# Patient Record
Sex: Male | Born: 1995 | Race: White | Hispanic: No | Marital: Single | Smoking: Never smoker
Health system: Southern US, Community
[De-identification: ages and names within clinical notes are randomized; demographics above are authoritative.]

## PROBLEM LIST (undated history)

## (undated) DIAGNOSIS — E109 Type 1 diabetes mellitus without complications: Secondary | ICD-10-CM

---
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
## 2014-03-12 ENCOUNTER — Ambulatory Visit: Payer: Managed Care, Other (non HMO) | Admitting: Internal Medicine

## 2014-12-24 ENCOUNTER — Telehealth: Payer: Self-pay

## 2014-12-24 NOTE — Telephone Encounter (Signed)
I contacted the pt's mother and advised A1C blood test is due. She stated the pt is seeing a Actor in [place]. At next office visit the mother is going to request the A1c results be faxed to Dr. Karle Starch office.

## 2015-09-04 ENCOUNTER — Telehealth: Payer: Self-pay

## 2015-09-04 NOTE — Telephone Encounter (Signed)
[facility] in [place] is the best place for inpatient stay for alcohol problems. AA is good for many people---especially if you can't stop work, school, etc If he doesn't want help, I am not sure what can help. Can set up appt for me to discuss with him what is going on--if she would like

## 2015-09-04 NOTE — Telephone Encounter (Signed)
Sean Perkins pts mom(DPR signed) left v/m; pt told his mother that he has a drinking problem and is asking for help; Sean Perkins wants to know who Dr Alphonsus Sias would suggest in this area for pt to go to to stop drinking. Sean Perkins request cb on 09/05/15.

## 2015-09-05 NOTE — Telephone Encounter (Signed)
Spoke to Sun MicrosystemsDeborah, patient's mom. She verbalized understanding. She will call us if she needs any help with him.

## 2015-10-28 ENCOUNTER — Ambulatory Visit: Payer: Managed Care, Other (non HMO) | Admitting: Internal Medicine

## 2015-10-31 ENCOUNTER — Ambulatory Visit (INDEPENDENT_AMBULATORY_CARE_PROVIDER_SITE_OTHER): Payer: Managed Care, Other (non HMO) | Admitting: Internal Medicine

## 2015-10-31 ENCOUNTER — Encounter: Payer: Self-pay | Admitting: Internal Medicine

## 2015-10-31 VITALS — BP 126/82 | HR 82 | Temp 98.4°F | Wt 146.5 lb

## 2015-10-31 DIAGNOSIS — F900 Attention-deficit hyperactivity disorder, predominantly inattentive type: Secondary | ICD-10-CM | POA: Diagnosis not present

## 2015-10-31 DIAGNOSIS — F419 Anxiety disorder, unspecified: Principal | ICD-10-CM

## 2015-10-31 DIAGNOSIS — F418 Other specified anxiety disorders: Secondary | ICD-10-CM | POA: Diagnosis not present

## 2015-10-31 DIAGNOSIS — F32A Depression, unspecified: Secondary | ICD-10-CM

## 2015-10-31 DIAGNOSIS — F329 Major depressive disorder, single episode, unspecified: Secondary | ICD-10-CM

## 2015-10-31 DIAGNOSIS — F1021 Alcohol dependence, in remission: Secondary | ICD-10-CM

## 2015-10-31 MED ORDER — ESCITALOPRAM OXALATE 10 MG PO TABS: ORAL_TABLET | ORAL | Status: DC

## 2015-10-31 NOTE — Patient Instructions (Signed)
Generalized Anxiety Disorder Generalized anxiety disorder (GAD) is a mental disorder. It interferes with life functions, including relationships, work, and school. GAD is different from normal anxiety, which everyone experiences at some point in their lives in response to specific life events and activities. Normal anxiety actually helps us prepare for and get through these life events and activities. Normal anxiety goes away after the event or activity is over.  GAD causes anxiety that is not necessarily related to specific events or activities. It also causes excess anxiety in proportion to specific events or activities. The anxiety associated with GAD is also difficult to control. GAD can vary from mild to severe. People with severe GAD can have intense waves of anxiety with physical symptoms (panic attacks).  SYMPTOMS The anxiety and worry associated with GAD are difficult to control. This anxiety and worry are related to many life events and activities and also occur more days than not for 6 months or longer. People with GAD also have three or more of the following symptoms (one or more in children):  Restlessness.   Fatigue.  Difficulty concentrating.   Irritability.  Muscle tension.  Difficulty sleeping or unsatisfying sleep. DIAGNOSIS GAD is diagnosed through an assessment by your health care provider. Your health care provider will ask you questions aboutyour mood,physical symptoms, and events in your life. Your health care provider may ask you about your medical history and use of alcohol or drugs, including prescription medicines. Your health care provider may also do a physical exam and blood tests. Certain medical conditions and the use of certain substances can cause symptoms similar to those associated with GAD. Your health care provider may refer you to a mental health specialist for further evaluation. TREATMENT The following therapies are usually used to treat GAD:    Medication. Antidepressant medication usually is prescribed for long-term daily control. Antianxiety medicines may be added in severe cases, especially when panic attacks occur.   Talk therapy (psychotherapy). Certain types of talk therapy can be helpful in treating GAD by providing support, education, and guidance. A form of talk therapy called cognitive behavioral therapy can teach you healthy ways to think about and react to daily life events and activities.  Stress managementtechniques. These include yoga, meditation, and exercise and can be very helpful when they are practiced regularly. A mental health specialist can help determine which treatment is best for you. Some people see improvement with one therapy. However, other people require a combination of therapies.   This information is not intended to replace advice given to you by your health care provider. Make sure you discuss any questions you have with your health care provider.   Document Released: 10/16/2012 Document Revised: 07/12/2014 Document Reviewed: 10/16/2012 Elsevier Interactive Patient Education 2016 Elsevier Inc.  

## 2015-10-31 NOTE — Progress Notes (Signed)
Subjective:    Patient ID: Sean Perkins, male    DOB: 11/11/1995, 20 y.o.   MRN: [id number]  HPI  Pt presents to the clinic today to discuss ADHD, anxiety and depression.  ADHD: He was tested in middle school by school psychologist Terence Lux, but never treated because his mother did not want him to have to take medication daily. He c/o difficulty with motivation, trouble focusing, difficulty concentrating only read a paragraph or two. He is currently working full time. He is able to make it to work on time. He may consider going back to school in the future.  Anxiety and Depression: He has never been treated for this in the past. He reports he feels more anxious than depressed. He denies SI/HI. He is concerned about having to take daily medication for this condition, but he is interested in some form of treatment.  Recent admission at [facility], for alcohol abuse. He has been sober since he has been out.  Review of Systems      Past Medical History  Diagnosis Date  . IDDM (insulin dependent diabetes mellitus) ([place])     Current Outpatient Prescriptions  Medication Sig Dispense Refill  . Blood Glucose Monitoring Suppl (ONE TOUCH ULTRA MINI) w/Device KIT by Does not apply route.    Marland Kitchen glucose blood (ONE TOUCH ULTRA TEST) test strip Use as directed to check BG 6 x daily    . Insulin Aspart (NOVOLOG FLEXPEN [place]) Use as directed up to 60 units daily    . Insulin Glargine (LANTUS SOLOSTAR [place]) Inject 30 Units as directed at bedtime.     Marland Kitchen escitalopram (LEXAPRO) 10 MG tablet Take 1/2 tablet daily x 1 week, then increase to a whole tablet 30 tablet 1   No current facility-administered medications for this visit.    No Known Allergies  Family History  Problem Relation Age of Onset  . Fibromyalgia Mother   . Hyperlipidemia Father   . Goiter Sister   . Cancer Other     breast  . Heart disease Other     Social History   Social History  . Marital Status: Single   Spouse Name: N/A  . Number of Children: N/A  . Years of Education: N/A   Occupational History  . Not on file.   Social History Main Topics  . Smoking status: Never Smoker   . Smokeless tobacco: Never Used  . Alcohol Use: No  . Drug Use: No  . Sexual Activity: Not on file   Other Topics Concern  . 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  Respiratory: Denies difficulty breathing, shortness of breath, cough or sputum production.   Cardiovascular: Denies chest pain, chest tightness, palpitations or swelling in the hands or feet.  Neurological: Denies dizziness, difficulty with memory, difficulty with speech or problems with balance and coordination.  Psych: Pt reports anxiety and depression. Denies SI/HI.  No other specific complaints in a complete review of systems (except as listed in HPI above).  Objective:   Physical Exam  BP 126/82 mmHg  Pulse 82  Temp(Src) 98.4 F (36.9 C) (Oral)  Wt 146 lb 8 oz (66.452 kg)  SpO2 98% Wt Readings from Last 3 Encounters:  10/31/15 146 lb 8 oz (66.452 kg)  12/12/13 138 lb (  62.596 kg) (27 %*, Z = -0.62)  08/25/12 133 lb (60.328 kg) (28 %*, Z = -0.58)   * Growth percentiles are based on CDC 2-20 Years data.    General: Appears his stated age, well developed, well nourished in NAD. Cardiovascular: Normal rate and rhythm. S1,S2 noted.  No murmur, rubs or gallops noted.  Pulmonary/Chest: Normal effort and positive vesicular breath sounds. No respiratory distress. No wheezes, rales or ronchi noted.  Neurological: Alert and oriented.  Psychiatric: He is very anxious.       Assessment & Plan:  ADHD:  I think we should treat his anxiety, since he is currently working without any issues at this time. If persist, advised him to bring a copy of the psychologist testing or we  could refer him for formal ADHD testing and treatment recommendations.  Anxiety and Depression:  PHQ 9 and anxiety questionnaire done Primarily anxiety He is not interested in CBT at this time Discussed treatment options with him Will trial Lexapro 10 mg daily  Prior alcohol abuse:  He is currently sober Advised him to join AA for extra support  RTC in 6 weeks to follow up anxiety and depression

## 2015-10-31 NOTE — Progress Notes (Signed)
Pre visit review using our clinic review tool, if applicable. No additional management support is needed unless otherwise documented below in the visit note. 

## 2015-11-28 ENCOUNTER — Ambulatory Visit: Payer: Self-pay | Admitting: Internal Medicine

## 2016-06-06 ENCOUNTER — Encounter: Payer: Self-pay | Admitting: *Deleted

## 2016-06-06 ENCOUNTER — Ambulatory Visit
Admission: EM | Admit: 2016-06-06 | Discharge: 2016-06-06 | Disposition: A | Discharge status: Home / Self Care | Payer: Managed Care, Other (non HMO) | Attending: Family Medicine | Admitting: Family Medicine

## 2016-06-06 DIAGNOSIS — J069 Acute upper respiratory infection, unspecified: Secondary | ICD-10-CM

## 2016-06-06 DIAGNOSIS — B9789 Other viral agents as the cause of diseases classified elsewhere: Secondary | ICD-10-CM | POA: Diagnosis not present

## 2016-06-06 NOTE — ED Provider Notes (Signed)
[facility]    CSN: [id number] Arrival date & time: 06/06/16  1315     History   Chief Complaint Chief Complaint  Patient presents with  . Cough  . Chest Pain  . Fever    HPI Sean Perkins is a 20 y.o. male.   The history is provided by the patient.  Cough  Associated symptoms: chest pain and fever   Associated symptoms: no headaches and no wheezing   Chest Pain  Associated symptoms: cough and fever   Associated symptoms: no headache   Fever  Associated symptoms: chest pain, congestion and cough   Associated symptoms: no headaches   URI  Presenting symptoms: congestion, cough and fever   Severity:  Moderate Onset quality:  Sudden Duration:  5 days Timing:  Constant Progression:  Worsening Chronicity:  New Relieved by:  Nothing Ineffective treatments:  OTC medications Associated symptoms: no headaches, no sinus pain and no wheezing   Risk factors: diabetes mellitus   Risk factors: not elderly, no chronic cardiac disease, no chronic kidney disease, no chronic respiratory disease, no immunosuppression, no recent illness, no recent travel and no sick contacts     Past Medical History:  Diagnosis Date  . IDDM (insulin dependent diabetes mellitus) [facility])     Patient Active Problem List   Diagnosis Date Noted  . Anxiety state, unspecified 12/12/2013  . Well adolescent visit 08/25/2012  . IDDM (insulin dependent diabetes mellitus) ([place])     History reviewed. No pertinent surgical history.     Home Medications    Prior to Admission medications   Medication Sig Start Date End Date Taking? Authorizing Provider  Blood Glucose Monitoring Suppl (ONE TOUCH ULTRA MINI) w/Device KIT by Does not apply route.   Yes Historical Provider, MD  glucose blood (ONE TOUCH ULTRA TEST) test strip Use as directed to check BG 6 x daily 07/27/13  Yes Historical Provider, MD  Insulin Aspart (NOVOLOG FLEXPEN [place]) Use as directed up to 60 units daily 07/27/13  Yes  Historical Provider, MD  Insulin Glargine (BASAGLAR KWIKPEN) 100 UNIT/ML SOPN Inject into the skin at bedtime.   Yes Historical Provider, MD  escitalopram (LEXAPRO) 10 MG tablet Take 1/2 tablet daily x 1 week, then increase to a whole tablet 10/31/15   Jearld Fenton, NP  Insulin Glargine (LANTUS SOLOSTAR [place]) Inject 30 Units as directed at bedtime.  07/27/13   Historical Provider, MD    Family History Family History  Problem Relation Age of Onset  . Fibromyalgia Mother   . Hyperlipidemia Father   . Goiter Sister   . Cancer Other     breast  . Heart disease Other     Social History Social History  Substance Use Topics  . Smoking status: Never Smoker  . Smokeless tobacco: Never Used  . Alcohol use No     Allergies   Patient has no known allergies.   Review of Systems Review of Systems  Constitutional: Positive for fever.  HENT: Positive for congestion. Negative for sinus pain.   Respiratory: Positive for cough. Negative for wheezing.   Cardiovascular: Positive for chest pain.  Neurological: Negative for headaches.     Physical Exam Triage Vital Signs ED Triage Vitals  Enc Vitals Group     BP 06/06/16 1334 (!) 143/88     Pulse Rate 06/06/16 1334 90     Resp 06/06/16 1334 16     Temp 06/06/16 1334 97.4 F (36.3 C)  Temp Source 06/06/16 1334 Oral     SpO2 06/06/16 1334 100 %     Weight 06/06/16 1337 140 lb (63.5 kg)     Height 06/06/16 1337 5' 10"  (1.778 m)     Head Circumference --      Peak Flow --      Pain Score --      Pain Loc --      Pain Edu? --      Excl. in [place]? --    No data found.   Updated Vital Signs BP (!) 143/88 (BP Location: Left Arm)   Pulse 90   Temp 97.4 F (36.3 C) (Oral)   Resp 16   Ht 5' 10"  (1.778 m)   Wt 140 lb (63.5 kg)   SpO2 100%   BMI 20.09 kg/m   Visual Acuity Right Eye Distance:   Left Eye Distance:   Bilateral Distance:    Right Eye Near:   Left Eye Near:    Bilateral Near:     Physical Exam    Constitutional: He appears well-developed and well-nourished. No distress.  HENT:  Head: Normocephalic and atraumatic.  Right Ear: Tympanic membrane, external ear and ear canal normal.  Left Ear: Tympanic membrane, external ear and ear canal normal.  Nose: Nose normal.  Mouth/Throat: Uvula is midline, oropharynx is clear and moist and mucous membranes are normal. No oropharyngeal exudate or tonsillar abscesses.  Eyes: Conjunctivae and EOM are normal. Pupils are equal, round, and reactive to light. Right eye exhibits no discharge. Left eye exhibits no discharge. No scleral icterus.  Neck: Normal range of motion. Neck supple. No tracheal deviation present. No thyromegaly present.  Cardiovascular: Normal rate, regular rhythm and normal heart sounds.   Pulmonary/Chest: Effort normal and breath sounds normal. No stridor. No respiratory distress. He has no wheezes. He has no rales. He exhibits no tenderness.  Lymphadenopathy:    He has no cervical adenopathy.  Neurological: He is alert.  Skin: Skin is warm and dry. No rash noted. He is not diaphoretic.  Nursing note and vitals reviewed.    UC Treatments / Results  Labs (all labs ordered are listed, but only abnormal results are displayed) Labs Reviewed - No data to display  EKG  EKG Interpretation None       Radiology No results found.  Procedures Procedures (including critical care time)  Medications Ordered in UC Medications - No data to display   Initial Impression / Assessment and Plan / UC Course  I have reviewed the triage vital signs and the nursing notes.  Pertinent labs & imaging results that were available during my care of the patient were reviewed by me and considered in my medical decision making (see chart for details).  Clinical Course       Final Clinical Impressions(s) / UC Diagnoses   Final diagnoses:  Viral URI with cough    New Prescriptions New Prescriptions   No medications on file   1.  diagnosis reviewed with patient 2. Recommend supportive treatment with rest, fluids, otc analgesics prn 3. Follow-up prn if symptoms worsen or don't improve   Norval Gable, MD 06/06/16 1435

## 2016-06-06 NOTE — ED Triage Notes (Signed)
Non-productive cough, sore throat, fever, chest soreness with coughing since Thursday. Pt has IDDM since he was 9. Today's CBG at home was 250, and states recent difficulty controlling his glucose due to this illness.

## 2016-06-09 ENCOUNTER — Telehealth: Payer: Self-pay

## 2016-06-09 NOTE — Telephone Encounter (Signed)
Courtesy call back completed today for patient's recent visit [facility]. Patient did not answer,unable to leave message on machine due to voicemail being full. Patient will call back with any questions or concerns.

## 2016-09-23 DIAGNOSIS — R05 Cough: Secondary | ICD-10-CM | POA: Diagnosis present

## 2016-09-23 DIAGNOSIS — E109 Type 1 diabetes mellitus without complications: Secondary | ICD-10-CM | POA: Insufficient documentation

## 2016-09-23 DIAGNOSIS — J069 Acute upper respiratory infection, unspecified: Secondary | ICD-10-CM | POA: Diagnosis not present

## 2016-09-23 NOTE — ED Triage Notes (Signed)
Pt in with co cough for over a week, has runny nose and congestion. Has tried otc nasal spray but has had little relief. Pt feels a lot of pressure to face and head.

## 2016-09-24 ENCOUNTER — Encounter: Payer: Self-pay | Admitting: Emergency Medicine

## 2016-09-24 ENCOUNTER — Emergency Department
Admission: EM | Admit: 2016-09-24 | Discharge: 2016-09-24 | Disposition: A | Discharge status: Home / Self Care | Payer: Commercial Managed Care - PPO | Attending: Emergency Medicine | Admitting: Emergency Medicine

## 2016-09-24 ENCOUNTER — Emergency Department: Payer: Commercial Managed Care - PPO

## 2016-09-24 DIAGNOSIS — J069 Acute upper respiratory infection, unspecified: Secondary | ICD-10-CM

## 2016-09-24 DIAGNOSIS — B9789 Other viral agents as the cause of diseases classified elsewhere: Secondary | ICD-10-CM

## 2016-09-24 HISTORY — DX: Type 1 diabetes mellitus without complications: E10.9

## 2016-09-24 MED ORDER — HYDROCODONE-HOMATROPINE 5-1.5 MG/5ML PO SYRP: 5.0000 mL | ORAL_SOLUTION | Freq: Four times a day (QID) | ORAL | 0 refills | Status: DC | PRN

## 2016-09-24 NOTE — ED Notes (Signed)
Patient transported to X-ray 

## 2016-09-24 NOTE — ED Provider Notes (Signed)
 [facility] Emergency Department Provider Note  ____________________________________________   First MD Initiated Contact with Patient 09/24/16 0111     (approximate)  I have reviewed the triage vital signs and the nursing notes.   HISTORY  Chief Complaint Cough    HPI Sean Perkins is a 20 y.o. male with a history of type 1 diabetes who presents for gradual onset of URI symptoms for about 5 days.  He reports general malaise, severe nasal congestion, some general body aches, fever which has resolved within the last day or 2, persistent productive cough, chest pain when he coughs, swollen lymph nodes in his neck, and a mild sore throat.These symptoms have been severe although they are getting better and are now mild.  He is mostly concerned because of the persistent productive cough which seems to be worse at night.  He denies any chest pain except for when he coughs, and denies nausea, vomiting, abdominal pain, diarrhea, constipation, dysuria.  Nothing in particular makes his symptoms better or worse.  Although he is a type I diabetic, he states that he tries hard to keep his glucose under control and it has been running a little higher than usual but not too bad (in the low 300s at times).   Past Medical History:  Diagnosis Date  . Type 1 diabetes (HCC)     Patient Active Problem List   Diagnosis Date Noted  . Anxiety state, unspecified 12/12/2013  . Well adolescent visit 08/25/2012  . IDDM (insulin dependent diabetes mellitus) (HCC)     History reviewed. No pertinent surgical history.  Prior to Admission medications   Medication Sig Start Date End Date Taking? Authorizing Provider  Blood Glucose Monitoring Suppl (ONE TOUCH ULTRA MINI) w/Device KIT by Does not apply route.    Historical Provider, MD  escitalopram (LEXAPRO) 10 MG tablet Take 1/2 tablet daily x 1 week, then increase to a whole tablet 10/31/15   Regina W Baity, NP  glucose blood (ONE  TOUCH ULTRA TEST) test strip Use as directed to check BG 6 x daily 07/27/13   Historical Provider, MD  HYDROcodone-homatropine (HYCODAN) 5-1.5 MG/5ML syrup Take 5 mLs by mouth every 6 (six) hours as needed for cough. 09/24/16   Cory Forbach, MD  Insulin Aspart (NOVOLOG FLEXPEN [place]) Use as directed up to 60 units daily 07/27/13   Historical Provider, MD  Insulin Glargine (BASAGLAR KWIKPEN) 100 UNIT/ML SOPN Inject into the skin at bedtime.    Historical Provider, MD  Insulin Glargine (LANTUS SOLOSTAR [place]) Inject 30 Units as directed at bedtime.  07/27/13   Historical Provider, MD    Allergies Patient has no known allergies.  Family History  Problem Relation Age of Onset  . Fibromyalgia Mother   . Hyperlipidemia Father   . Goiter Sister   . Cancer Other     breast  . Heart disease Other     Social History Social History  Substance Use Topics  . Smoking status: Never Smoker  . Smokeless tobacco: Never Used  . Alcohol use No    Review of Systems Constitutional: Fever/chills several days ago, now resolved.   Eyes: No visual changes. ENT: No sore throat. Cardiovascular: Denies chest pain. Respiratory: Denies shortness of breath. Gastrointestinal: No abdominal pain.  No nausea, no vomiting.  No diarrhea.  No constipation. Genitourinary: Negative for dysuria. Musculoskeletal: Negative for back pain. Skin: Negative for rash. Neurological: Negative for headaches, focal weakness or numbness.  10-point ROS otherwise negative.  ____________________________________________     PHYSICAL EXAM:  VITAL SIGNS: ED Triage Vitals  Enc Vitals Group     BP 09/23/16 2217 (!) 154/95     Pulse Rate 09/23/16 2217 86     Resp 09/23/16 2217 18     Temp 09/23/16 2217 97.5 F (36.4 C)     Temp Source 09/23/16 2217 Oral     SpO2 09/23/16 2217 100 %     Weight 09/23/16 2217 140 lb (63.5 kg)     Height 09/23/16 2217 5' 10" (1.778 m)     Head Circumference --      Peak Flow --      Pain Score  09/23/16 2221 4     Pain Loc --      Pain Edu? --      Excl. in [place]? --     Constitutional: Alert and oriented. Well appearing and in no acute distress. Eyes: Conjunctivae are normal. PERRL. EOMI. Head: Atraumatic. Nose: Significant congestion Mouth/Throat: Mucous membranes are moist.  Oropharynx non-erythematous. Neck: No stridor.  No meningeal signs.  No cervical lymphadenopathy Cardiovascular: Normal rate, regular rhythm. Good peripheral circulation. Grossly normal heart sounds. Respiratory: Normal respiratory effort.  No retractions. Lungs CTAB. Gastrointestinal: Soft and nontender. No distention.  Musculoskeletal: No lower extremity tenderness nor edema. No gross deformities of extremities. Neurologic:  Normal speech and language. No gross focal neurologic deficits are appreciated.  Skin:  Skin is warm, dry and intact. No rash noted. Psychiatric: Mood and affect are normal. Speech and behavior are normal.  ____________________________________________   LABS (all labs ordered are listed, but only abnormal results are displayed)  Labs Reviewed - No data to display ____________________________________________  EKG  None - EKG not ordered by ED physician ____________________________________________  RADIOLOGY   Dg Chest 2 View  Result Date: 09/24/2016 CLINICAL DATA:  Acute onset of dry cough, chest congestion and lower mid chest pain. Fever. Initial encounter. EXAM: CHEST  2 VIEW COMPARISON:  None. FINDINGS: The lungs are well-aerated and clear. There is no evidence of focal opacification, pleural effusion or pneumothorax. The heart is normal in size; the mediastinal contour is within normal limits. No acute osseous abnormalities are seen. IMPRESSION: No acute cardiopulmonary process seen. Electronically Signed   By: Garald Balding M.D.   On: 09/24/2016 01:55    ____________________________________________   PROCEDURES  Critical Care performed: No   Procedure(s)  performed:   Procedures   ____________________________________________   INITIAL IMPRESSION / ASSESSMENT AND PLAN / ED COURSE  Pertinent labs & imaging results that were available during my care of the patient were reviewed by me and considered in my medical decision making (see chart for details).  The patient is well-appearing and in no acute distress.  He has obvious viral symptoms but given that he has type 1 diabetes and has productive cough I will check a two-view chest x-ray to make sure he does not have any evidence of pneumonia.  I explained that he may have influenza B which has been or prevalent recently but his vital signs are normal and he has had symptoms for 5 days so there is no indication to check specifically for influenza.  I anticipate that he will go home after the chest x-ray and I gave my usual and customary viral infection recognition's and return precautions.   Clinical Course as of Sep 25 250  Fri Sep 24, 2016  0113 I reviewed the patient's prescription history over the last 12 months in the multi-state controlled substances database(s) that  includes [place], [place], [place], [place], [place], [place], [place], [place], [place], [place], [place], [place], [place], and [place].  The patient has filled no controlled substances during that time.   [CF]  0200 No acute process appreciated.  Will proceed with plan for d/c and outpatient follow up. DG Chest 2 View [CF]    Clinical Course User Index [CF] Hinda Kehr, MD    ____________________________________________  FINAL CLINICAL IMPRESSION(S) / ED DIAGNOSES  Final diagnoses:  Viral URI with cough     MEDICATIONS GIVEN DURING THIS VISIT:  Medications - No data to display   NEW OUTPATIENT MEDICATIONS STARTED DURING THIS VISIT:  Discharge Medication List as of 09/24/2016  2:25 AM    START taking these medications   Details  HYDROcodone-homatropine (HYCODAN) 5-1.5  MG/5ML syrup Take 5 mLs by mouth every 6 (six) hours as needed for cough., Starting Fri 09/24/2016, Print        Discharge Medication List as of 09/24/2016  2:25 AM      Discharge Medication List as of 09/24/2016  2:25 AM       Note:  This document was prepared using Dragon voice recognition software and may include unintentional dictation errors.    Hinda Kehr, MD 09/24/16 [phone]

## 2016-09-24 NOTE — Discharge Instructions (Signed)

## 2016-12-14 ENCOUNTER — Ambulatory Visit
Admission: EM | Admit: 2016-12-14 | Discharge: 2016-12-14 | Disposition: A | Discharge status: Home / Self Care | Payer: Commercial Managed Care - PPO | Attending: Family Medicine | Admitting: Family Medicine

## 2016-12-14 DIAGNOSIS — L03115 Cellulitis of right lower limb: Secondary | ICD-10-CM

## 2016-12-14 DIAGNOSIS — S80861A Insect bite (nonvenomous), right lower leg, initial encounter: Secondary | ICD-10-CM

## 2016-12-14 DIAGNOSIS — W57XXXA Bitten or stung by nonvenomous insect and other nonvenomous arthropods, initial encounter: Secondary | ICD-10-CM

## 2016-12-14 MED ORDER — DOXYCYCLINE HYCLATE 100 MG PO TABS: 100.0000 mg | ORAL_TABLET | Freq: Two times a day (BID) | ORAL | 0 refills | Status: DC

## 2016-12-14 NOTE — ED Provider Notes (Signed)
[facility]    CSN: [id number] Arrival date & time: 12/14/16  1806     History   Chief Complaint Chief Complaint  Patient presents with  . Tick Removal    HPI Sean Perkins is a 21 y.o. male.   21 yo male with a c/o tick bite to left ankle area and subsequent redness, warmth and tenderness to the area since yesterday. Patient states he pulled a tick off 2 days ago after being embedded less than 24 hours then yesterday noticed the rash. Denies any fevers, chills, drainage.    The history is provided by the patient.    Past Medical History:  Diagnosis Date  . Type 1 diabetes [facility])     Patient Active Problem List   Diagnosis Date Noted  . Anxiety state, unspecified 12/12/2013  . Well adolescent visit 08/25/2012  . IDDM (insulin dependent diabetes mellitus) ([place])     History reviewed. No pertinent surgical history.     Home Medications    Prior to Admission medications   Medication Sig Start Date End Date Taking? Authorizing Provider  Insulin Aspart (NOVOLOG FLEXPEN [place]) Use as directed up to 60 units daily 07/27/13  Yes [provider]  Insulin Glargine (BASAGLAR KWIKPEN) 100 UNIT/ML SOPN Inject into the skin at bedtime.   Yes [provider]  Blood Glucose Monitoring Suppl (ONE TOUCH ULTRA MINI) w/Device KIT by Does not apply route.    [provider]  doxycycline (VIBRA-TABS) 100 MG tablet Take 1 tablet (100 mg total) by mouth 2 (two) times daily. 12/14/16   Norval Gable, MD  escitalopram (LEXAPRO) 10 MG tablet Take 1/2 tablet daily x 1 week, then increase to a whole tablet 10/31/15   Jearld Fenton, NP  glucose blood (ONE TOUCH ULTRA TEST) test strip Use as directed to check BG 6 x daily 07/27/13   [provider]  HYDROcodone-homatropine (HYCODAN) 5-1.5 MG/5ML syrup Take 5 mLs by mouth every 6 (six) hours as needed for cough. 09/24/16   Hinda Kehr, MD  Insulin Glargine (LANTUS SOLOSTAR [place]) Inject 30 Units as  directed at bedtime.  07/27/13   [provider]    Family History Family History  Problem Relation Age of Onset  . Fibromyalgia Mother   . Hyperlipidemia Father   . Goiter Sister   . Cancer Other        breast  . Heart disease Other     Social History Social History  Substance Use Topics  . Smoking status: Never Smoker  . Smokeless tobacco: Never Used  . Alcohol use No     Allergies   Patient has no known allergies.   Review of Systems Review of Systems   Physical Exam Triage Vital Signs ED Triage Vitals  Enc Vitals Group     BP 12/14/16 1944 (!) 137/95     Pulse Rate 12/14/16 1944 67     Resp 12/14/16 1944 18     Temp 12/14/16 1944 97.6 F (36.4 C)     Temp Source 12/14/16 1944 Oral     SpO2 12/14/16 1944 100 %     Weight 12/14/16 1945 140 lb (63.5 kg)     Height 12/14/16 1945 _0  (1.778 m)     Head Circumference --      Peak Flow --      Pain Score 12/14/16 1944 0     Pain Loc --      Pain Edu? --  Excl. in [place]? --    No data found.   Updated Vital Signs BP (!) 137/95 (BP Location: Left Arm)   Pulse 67   Temp 97.6 F (36.4 C) (Oral)   Resp 18   Ht _0  (1.778 m)   Wt 140 lb (63.5 kg)   SpO2 100%   BMI 20.09 kg/m   Visual Acuity Right Eye Distance:   Left Eye Distance:   Bilateral Distance:    Right Eye Near:   Left Eye Near:    Bilateral Near:     Physical Exam  Constitutional: He appears well-developed and well-nourished. No distress.  Skin: He is not diaphoretic. There is erythema.  3x4cm skin area over medial left ankle with pinpoint puncture wound and surrounding warmth, blanchable erythema, and tenderness to palpation; no drainage  Nursing note and vitals reviewed.    UC Treatments / Results  Labs (all labs ordered are listed, but only abnormal results are displayed) Labs Reviewed - No data to display  EKG  EKG Interpretation None       Radiology No results found.  Procedures Procedures  (including critical care time)  Medications Ordered in UC Medications - No data to display   Initial Impression / Assessment and Plan / UC Course  I have reviewed the triage vital signs and the nursing notes.  Pertinent labs & imaging results that were available during my care of the patient were reviewed by me and considered in my medical decision making (see chart for details).       Final Clinical Impressions(s) / UC Diagnoses   Final diagnoses:  Tick bite, initial encounter  Cellulitis of leg, right    New Prescriptions Discharge Medication List as of 12/14/2016  8:41 PM    START taking these medications   Details  doxycycline (VIBRA-TABS) 100 MG tablet Take 1 tablet (100 mg total) by mouth 2 (two) times daily., Starting Tue 12/14/2016, Normal       1. diagnosis reviewed with patient 2. rx as per orders above; reviewed possible side effects, interactions, risks and benefits  3. Recommend supportive treatment with warm compresses to area 4. Follow-up prn if symptoms worsen or don't improve   Norval Gable, MD 12/14/16 2047

## 2016-12-14 NOTE — ED Triage Notes (Signed)
Pt removed tick from left ankle about 2 days ago and he now has a red rash around the area.

## 2017-07-06 ENCOUNTER — Ambulatory Visit
Admission: EM | Admit: 2017-07-06 | Discharge: 2017-07-06 | Disposition: A | Discharge status: Home / Self Care | Payer: Commercial Managed Care - PPO | Attending: Family Medicine | Admitting: Family Medicine

## 2017-07-06 ENCOUNTER — Encounter: Payer: Self-pay | Admitting: Emergency Medicine

## 2017-07-06 ENCOUNTER — Ambulatory Visit (INDEPENDENT_AMBULATORY_CARE_PROVIDER_SITE_OTHER): Payer: Commercial Managed Care - PPO

## 2017-07-06 ENCOUNTER — Other Ambulatory Visit: Payer: Self-pay

## 2017-07-06 DIAGNOSIS — S62145A Nondisplaced fracture of body of hamate [unciform] bone, left wrist, initial encounter for closed fracture: Secondary | ICD-10-CM | POA: Diagnosis not present

## 2017-07-06 NOTE — ED Triage Notes (Signed)
Patient states tha he was punching a bag at the gym and has pain in his left hand.

## 2017-07-06 NOTE — ED Provider Notes (Signed)
[facility] ____________________________________________  Time seen: Approximately 4562 PM  I have reviewed the triage vital signs and the nursing notes.   HISTORY  Chief Complaint Hand Pain (left)   HPI Sean Perkins is a 22 y.o. male presenting for evaluation of left hand pain since last night after being at the gym and punching a punching bag.  Patient reports left hand dominant.  Denies other pain or injuries.  States no previous fractures or trauma to left hand to his knowledge in the past.  Has not taken any over-the-counter medications for the same complaint.  Denies pain radiation, decreased movement, paresthesias or other complaints.  Reports otherwise feels well.  Denies other complaints Denies recent sickness. Denies recent antibiotic use.  Type I diabetic, reports blood sugars have been doing well.  Venia Carbon, MD: PCP   Past Medical History:  Diagnosis Date  . Type 1 diabetes [facility])     Patient Active Problem List   Diagnosis Date Noted  . Anxiety state, unspecified 12/12/2013  . Well adolescent visit 08/25/2012  . IDDM (insulin dependent diabetes mellitus) ([place])     History reviewed. No pertinent surgical history.   No current facility-administered medications for this encounter.   Current Outpatient Medications:  .  Insulin Aspart (NOVOLOG FLEXPEN [place]), Use as directed up to 60 units daily, Disp: , Rfl:  .  Insulin Glargine (LANTUS SOLOSTAR [employer]), Inject 30 Units as directed at bedtime. , Disp: , Rfl:  .  Blood Glucose Monitoring Suppl (ONE TOUCH ULTRA MINI) w/Device KIT, by Does not apply route., Disp: , Rfl:  .  glucose blood (ONE TOUCH ULTRA TEST) test strip, Use as directed to check BG 6 x daily, Disp: , Rfl:  .  HYDROcodone-homatropine (HYCODAN) 5-1.5 MG/5ML syrup, Take 5 mLs by mouth every 6 (six) hours as needed for cough., Disp: 120 mL, Rfl: 0 .  Insulin Glargine (BASAGLAR KWIKPEN) 100 UNIT/ML SOPN, Inject into the skin at bedtime.,  Disp: , Rfl:   Allergies Patient has no known allergies.  Family History  Problem Relation Age of Onset  . Fibromyalgia Mother   . Hyperlipidemia Father   . Goiter Sister   . Cancer Other        breast  . Heart disease Other     Social History Social History   Tobacco Use  . Smoking status: Never Smoker  . Smokeless tobacco: Never Used  Substance Use Topics  . Alcohol use: No    Alcohol/week: 0.0 oz  . Drug use: No    Review of Systems Constitutional: No fever/chills Cardiovascular: Denies chest pain. Respiratory: Denies shortness of breath. Gastrointestinal: No abdominal pain.   Musculoskeletal: Negative for back pain. Skin: Negative for rash.  ____________________________________________   PHYSICAL EXAM:  VITAL SIGNS: ED Triage Vitals  Enc Vitals Group     BP 07/06/17 1304 (!) 148/94     Pulse Rate 07/06/17 1304 78     Resp 07/06/17 1304 16     Temp 07/06/17 1304 98 F (36.7 C)     Temp Source 07/06/17 1304 Oral     SpO2 07/06/17 1304 100 %     Weight 07/06/17 1302 140 lb (63.5 kg)     Height 07/06/17 1302 5' 10"  (1.778 m)     Head Circumference --      Peak Flow --      Pain Score 07/06/17 1302 7     Pain Loc --      Pain Edu? --  Excl. in [place]? --     Constitutional: Alert and oriented. Well appearing and in no acute distress. Cardiovascular: Normal rate, regular rhythm. Grossly normal heart sounds.  Good peripheral circulation. Respiratory: Normal respiratory effort without tachypnea nor retractions. Breath sounds are clear and equal bilaterally. No wheezes, rales, rhonchi.. Musculoskeletal:   No midline cervical, thoracic or lumbar tenderness to palpation. Bilateral distal radial pulses equal and easily palpated. Except: Left proximal hand ulnar deviation proximal fourth and fifth metacarpal and mid metacarpals mild to moderate tenderness to palpation with mild localized swelling and ecchymosis, pain present with fourth and fifth digit resisted  flexion and extension, but full range of motion present, no motor or tendon deficit noted to left hand, normal distal sensation and capillary refill to left hand.  Left upper extremity otherwise nontender. Neurologic:  Normal speech and language.  Speech is normal. No gait instability.  Skin:  Skin is warm, dry and intact. No rash noted. Psychiatric: Mood and affect are normal. Speech and behavior are normal. Patient exhibits appropriate insight and judgment   ___________________________________________   LABS (all labs ordered are listed, but only abnormal results are displayed)  Labs Reviewed - No data to display ____________________________________________  RADIOLOGY  Dg Hand Complete Left  Addendum Date: 07/06/2017   ADDENDUM REPORT: 07/06/2017 13:42 ADDENDUM: After further review, there is pain at the base of the fifth metacarpal. There is soft tissue swelling dorsally in this area and possible fracture of the dorsal lateral hamate. Electronically Signed   By: Franchot Gallo M.D.   On: 07/06/2017 13:42   Result Date: 07/06/2017 CLINICAL DATA:  Left hand pain EXAM: LEFT HAND - COMPLETE 3+ VIEW COMPARISON:  None. FINDINGS: Mild deformity of the fifth metacarpal consistent with chronic healed fracture. No acute fracture or arthropathy. IMPRESSION: Negative. Electronically Signed: By: Franchot Gallo M.D. On: 07/06/2017 13:21   ____________________________________________   PROCEDURES Procedures   INITIAL IMPRESSION / ASSESSMENT AND PLAN / ED COURSE  Pertinent labs & imaging results that were available during my care of the patient were reviewed by me and considered in my medical decision making (see chart for details).  Well-appearing patient.  No acute distress.  Left hand pain post mechanical injury that occurred yesterday.  In reviewing the x-ray myself, suspect hamate fracture, called and discussed and reviewed with radiologist, who verbalized agreement and addendum as above.   Ulnar gutter OCL splint applied by nursing.  Encouraged ice, elevation.  Patient declined need for prescription pain medication states he will take over-the-counter Tylenol ibuprofen as needed.  Follow-up with orthopedic this week.  Information given.  Discussed follow up with Primary care physician this week as needed. Discussed follow up and return parameters including no resolution or any worsening concerns. Patient verbalized understanding and agreed to plan.   ____________________________________________   FINAL CLINICAL IMPRESSION(S) / ED DIAGNOSES  Final diagnoses:  Closed nondisplaced fracture of body of hamate of left wrist, initial encounter     ED Discharge Orders    None       Note: This dictation was prepared with Dragon dictation along with smaller phrase technology. Any transcriptional errors that result from this process are unintentional.         Marylene Land, NP 07/06/17 1525

## 2017-07-06 NOTE — Discharge Instructions (Signed)
Ice. Rest. Drink plenty of fluids.   Follow up with orthopedic this week. CAll today.  Follow up with your primary care physician this week as needed. Return to Urgent care for new or worsening concerns.

## 2017-08-02 ENCOUNTER — Other Ambulatory Visit: Payer: Self-pay

## 2017-08-02 ENCOUNTER — Ambulatory Visit
Admission: EM | Admit: 2017-08-02 | Discharge: 2017-08-02 | Disposition: A | Discharge status: Home / Self Care | Payer: Commercial Managed Care - PPO | Attending: Family Medicine | Admitting: Family Medicine

## 2017-08-02 DIAGNOSIS — R509 Fever, unspecified: Secondary | ICD-10-CM

## 2017-08-02 DIAGNOSIS — J039 Acute tonsillitis, unspecified: Secondary | ICD-10-CM

## 2017-08-02 DIAGNOSIS — J029 Acute pharyngitis, unspecified: Secondary | ICD-10-CM

## 2017-08-02 LAB — RAPID STREP SCREEN (MED CTR MEBANE ONLY): Streptococcus, Group A Screen (Direct): NEGATIVE

## 2017-08-02 MED ORDER — AMOXICILLIN 500 MG PO TABS: 500.0000 mg | ORAL_TABLET | Freq: Two times a day (BID) | ORAL | 0 refills | Status: DC

## 2017-08-02 NOTE — ED Triage Notes (Addendum)
4 days of sore throat and occasional cough. "My tonsils are killing me."

## 2017-08-02 NOTE — ED Provider Notes (Signed)
[facility]    CSN: [id number] Arrival date & time: 08/02/17  1404  History   Chief Complaint Chief Complaint  Patient presents with  . Sore Throat   HPI  22 year old male presents with sore throat.  Sore throat x 4 days.  Associated fever, T-max 100.0.  Associated mild cough.  No improvement with over-the-counter Tylenol.  No reported sick contacts.  No known exacerbating factors.  No other reported symptoms.  No other complaints this time.  Past Medical History:  Diagnosis Date  . Type 1 diabetes [facility])    Patient Active Problem List   Diagnosis Date Noted  . Anxiety state, unspecified 12/12/2013  . Well adolescent visit 08/25/2012  . IDDM (insulin dependent diabetes mellitus) ([place])    History reviewed. No pertinent surgical history.  Home Medications    Prior to Admission medications   Medication Sig Start Date End Date Taking? Authorizing Provider  insulin lispro (HUMALOG) 100 UNIT/ML injection Inject into the skin 3 (three) times daily before meals.   Yes [provider]  amoxicillin (AMOXIL) 500 MG tablet Take 1 tablet (500 mg total) by mouth 2 (two) times daily. 08/02/17   Coral Spikes, DO  Blood Glucose Monitoring Suppl (ONE TOUCH ULTRA MINI) w/Device KIT by Does not apply route.    [provider]  glucose blood (ONE TOUCH ULTRA TEST) test strip Use as directed to check BG 6 x daily 07/27/13   [provider]  Insulin Glargine (BASAGLAR KWIKPEN) 100 UNIT/ML SOPN Inject 27 Units into the skin at bedtime.     [provider]    Family History Family History  Problem Relation Age of Onset  . Fibromyalgia Mother   . Hyperlipidemia Father   . Goiter Sister   . Cancer Other        breast  . Heart disease Other     Social History Social History   Tobacco Use  . Smoking status: Never Smoker  . Smokeless tobacco: Never Used  Substance Use Topics  . Alcohol use: No    Alcohol/week: 0.0 oz  . Drug use: No      Allergies   Patient has no known allergies.   Review of Systems Review of Systems  Constitutional: Positive for fever.  HENT: Positive for sore throat.   Respiratory: Positive for cough.    Physical Exam Triage Vital Signs ED Triage Vitals [08/02/17 1430]  Enc Vitals Group     BP (!) 148/88     Pulse Rate 75     Resp 18     Temp 99.3 F (37.4 C)     Temp Source Oral     SpO2 99 %     Weight 140 lb (63.5 kg)     Height 5' 10"  (1.778 m)     Head Circumference      Peak Flow      Pain Score 8     Pain Loc      Pain Edu?      Excl. in [place]?    Updated Vital Signs BP (!) 148/88 (BP Location: Left Arm)   Pulse 75   Temp 99.3 F (37.4 C) (Oral)   Resp 18   Ht 5' 10"  (1.778 m)   Wt 140 lb (63.5 kg)   SpO2 99%   BMI 20.09 kg/m     Physical Exam  Constitutional: He is oriented to person, place, and time. He appears well-developed and well-nourished. No distress.  HENT:  Head: Normocephalic and atraumatic.  Oropharynx with erythema and tonsillar edema, left greater than right.  No exudate.  Cardiovascular: Normal rate and regular rhythm.  Pulmonary/Chest: Effort normal and breath sounds normal. He has no wheezes. He has no rales.  Neurological: He is alert and oriented to person, place, and time.  Psychiatric: He has a normal mood and affect. His behavior is normal.  Nursing note and vitals reviewed.  UC Treatments / Results  Labs (all labs ordered are listed, but only abnormal results are displayed) Labs Reviewed  RAPID STREP SCREEN (NOT AT [facility])  CULTURE, GROUP A STREP [facility])    EKG  EKG Interpretation None       Radiology No results found.  Procedures Procedures (including critical care time)  Medications Ordered in UC Medications - No data to display   Initial Impression / Assessment and Plan / UC Course  I have reviewed the triage vital signs and the nursing notes.  Pertinent labs & imaging results that were available during my care of  the patient were reviewed by me and considered in my medical decision making (see chart for details).     22 year old male presents sore throat.  Rapid strep negative.  Given his exam and history, I am concerned about culture returning positive. As a result, I am starting him on empiric Amoxicillin.  Final Clinical Impressions(s) / UC Diagnoses   Final diagnoses:  Tonsillitis    ED Discharge Orders        Ordered    amoxicillin (AMOXIL) 500 MG tablet  2 times daily     08/02/17 1551     Controlled Substance Prescriptions [place] Controlled Substance Registry consulted? Not Applicable   Coral Spikes, DO 08/02/17 1623

## 2017-08-05 LAB — CULTURE, GROUP A STREP (THRC)

## 2018-05-03 LAB — HM DIABETES EYE EXAM

## 2018-05-08 ENCOUNTER — Telehealth: Payer: Self-pay

## 2018-05-08 NOTE — Telephone Encounter (Signed)
Left message with Dr Leonides Cave office in regards to diabetic eye exam report we received on the patient. Awaiting response sometime later today before abstracting.

## 2018-05-10 ENCOUNTER — Encounter: Payer: Self-pay | Admitting: Internal Medicine

## 2018-05-12 ENCOUNTER — Encounter: Payer: Self-pay | Admitting: Optometry

## 2018-05-12 NOTE — Telephone Encounter (Signed)
Diabetic eye exam updated

## 2018-10-22 IMAGING — CR DG CHEST 2V
1 series · 2 of 2 positions shown · non-contrast
Comparison: None.

CLINICAL DATA: Acute onset of dry cough, chest congestion and lower
mid chest pain. Fever. Initial encounter.

EXAM:
CHEST  2 VIEW

[Series 1: dg chest 2 view · 0.14mm/px · 2 of 2 slices shown]
[im 1/2]
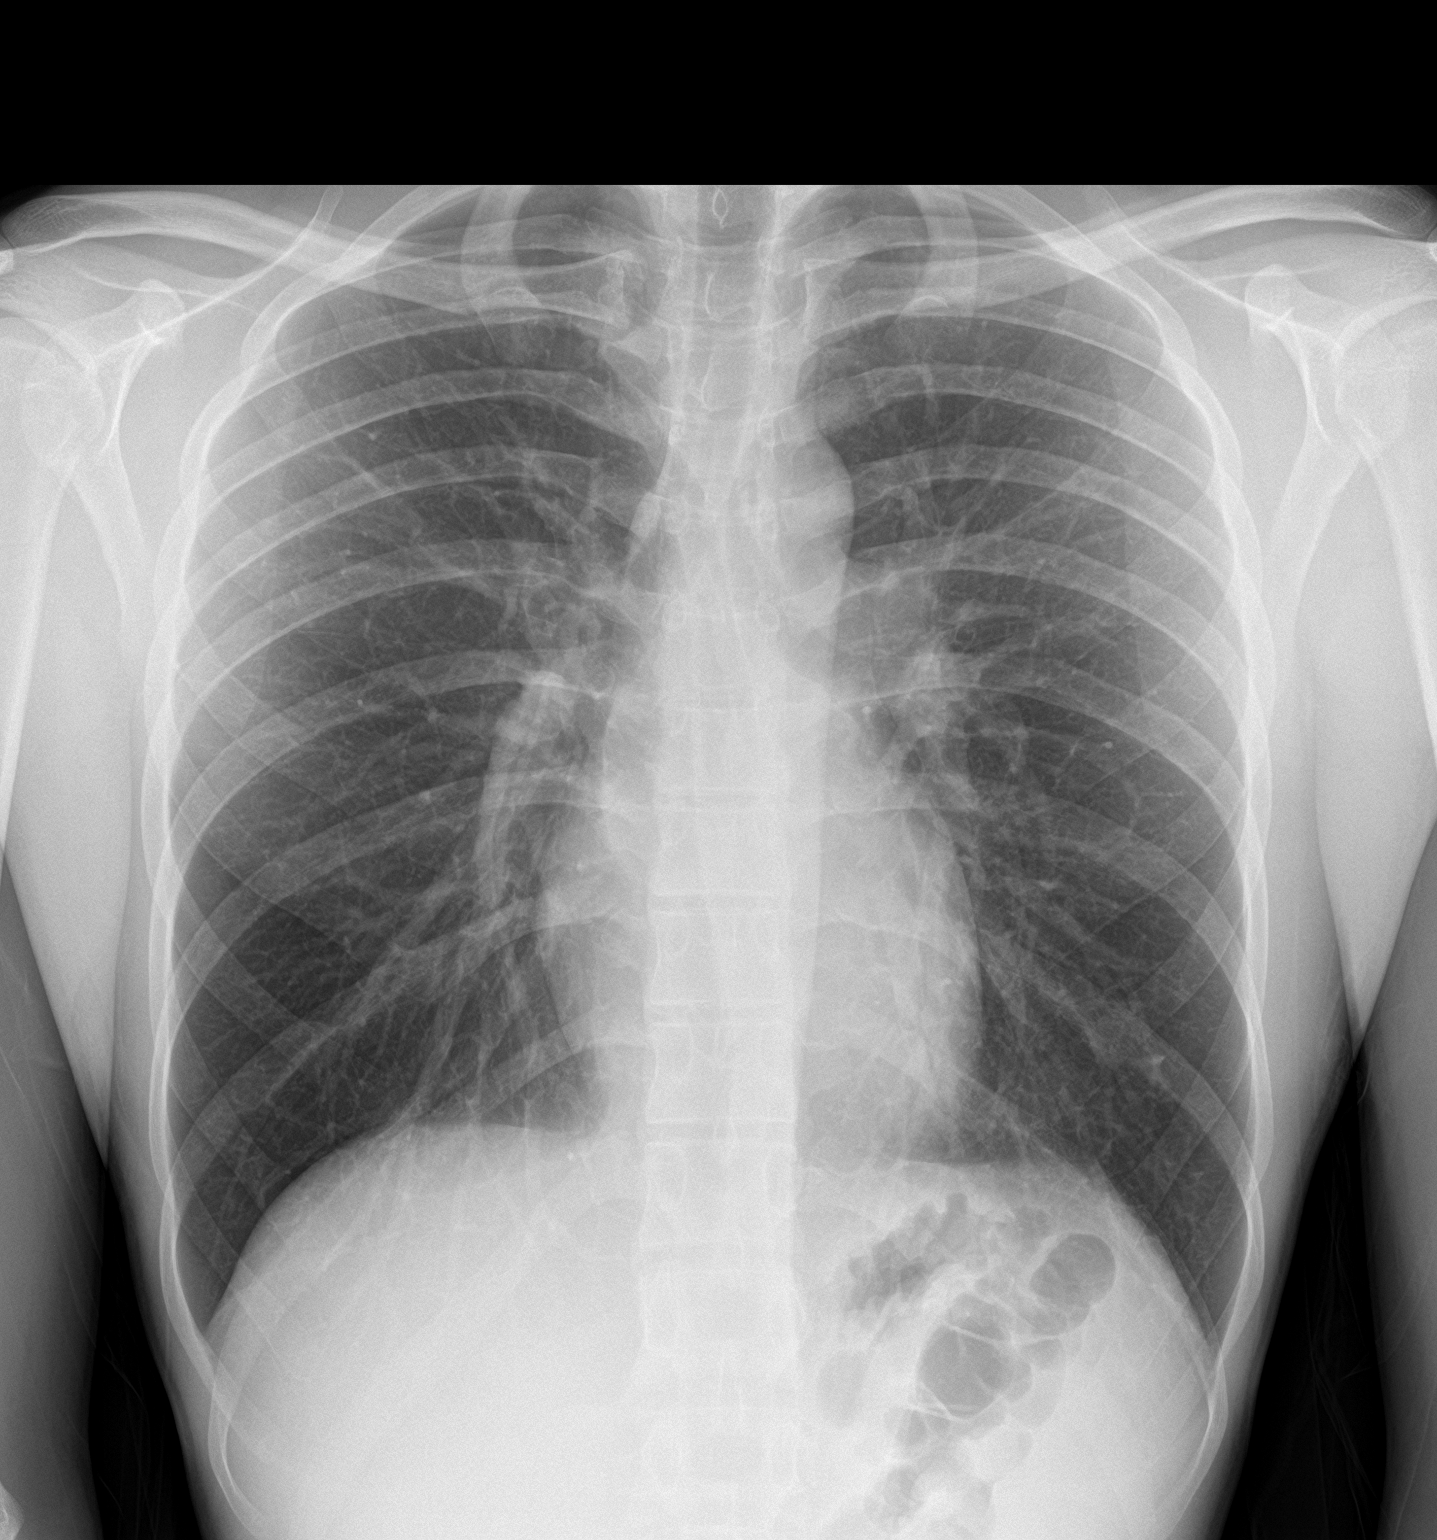
[im 2/2]
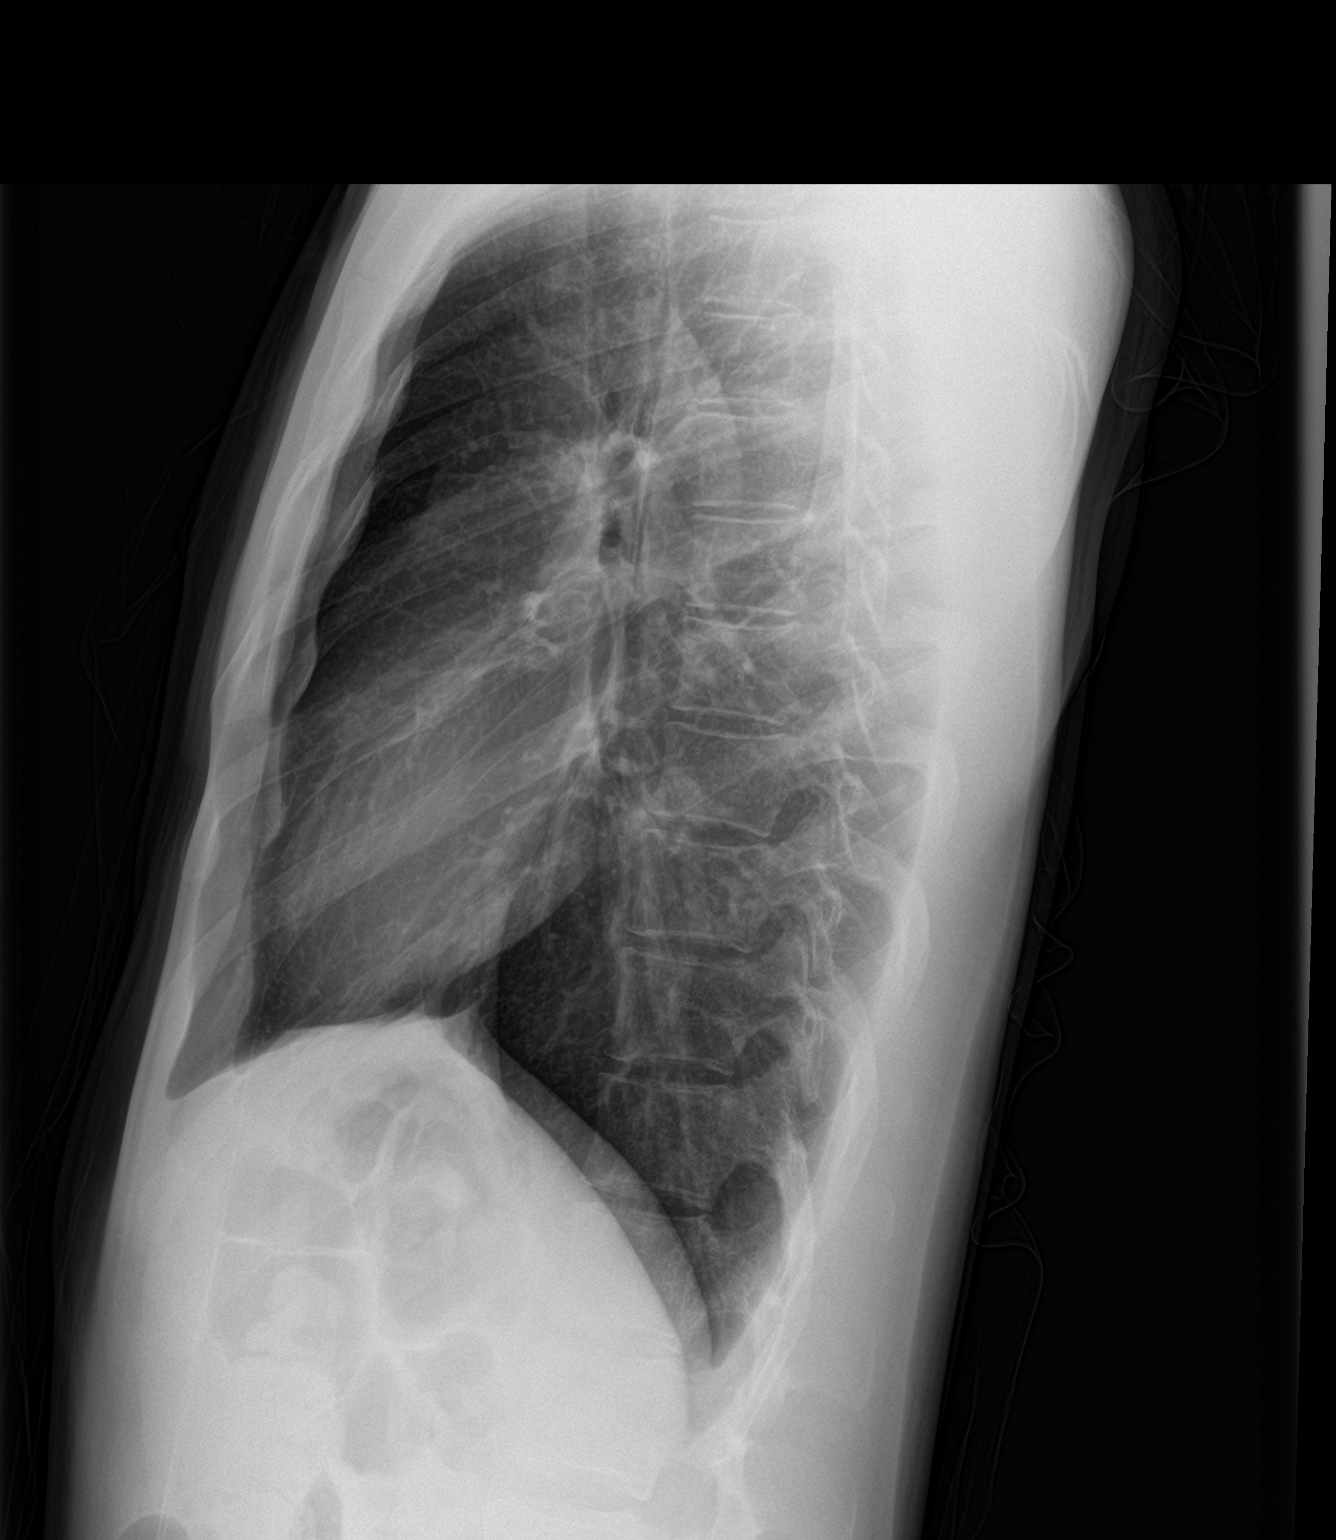

[2 of 2 positions shown; findings below may reference images not displayed]

FINDINGS: The lungs are well-aerated and clear. There is no evidence of focal
opacification, pleural effusion or pneumothorax.

The heart is normal in size; the mediastinal contour is within
normal limits. No acute osseous abnormalities are seen.
IMPRESSION: No acute cardiopulmonary process seen.

## 2019-08-03 IMAGING — CR DG HAND COMPLETE 3+V*L*
3 series · 4 of 4 positions shown · non-contrast
Comparison: None.

ADDENDUM:
After further review, there is pain at the base of the fifth
metacarpal. There is soft tissue swelling dorsally in this area and
possible fracture of the dorsal lateral hamate.
CLINICAL DATA: Left hand pain

EXAM:
LEFT HAND - COMPLETE 3+ VIEW

[hand ap]
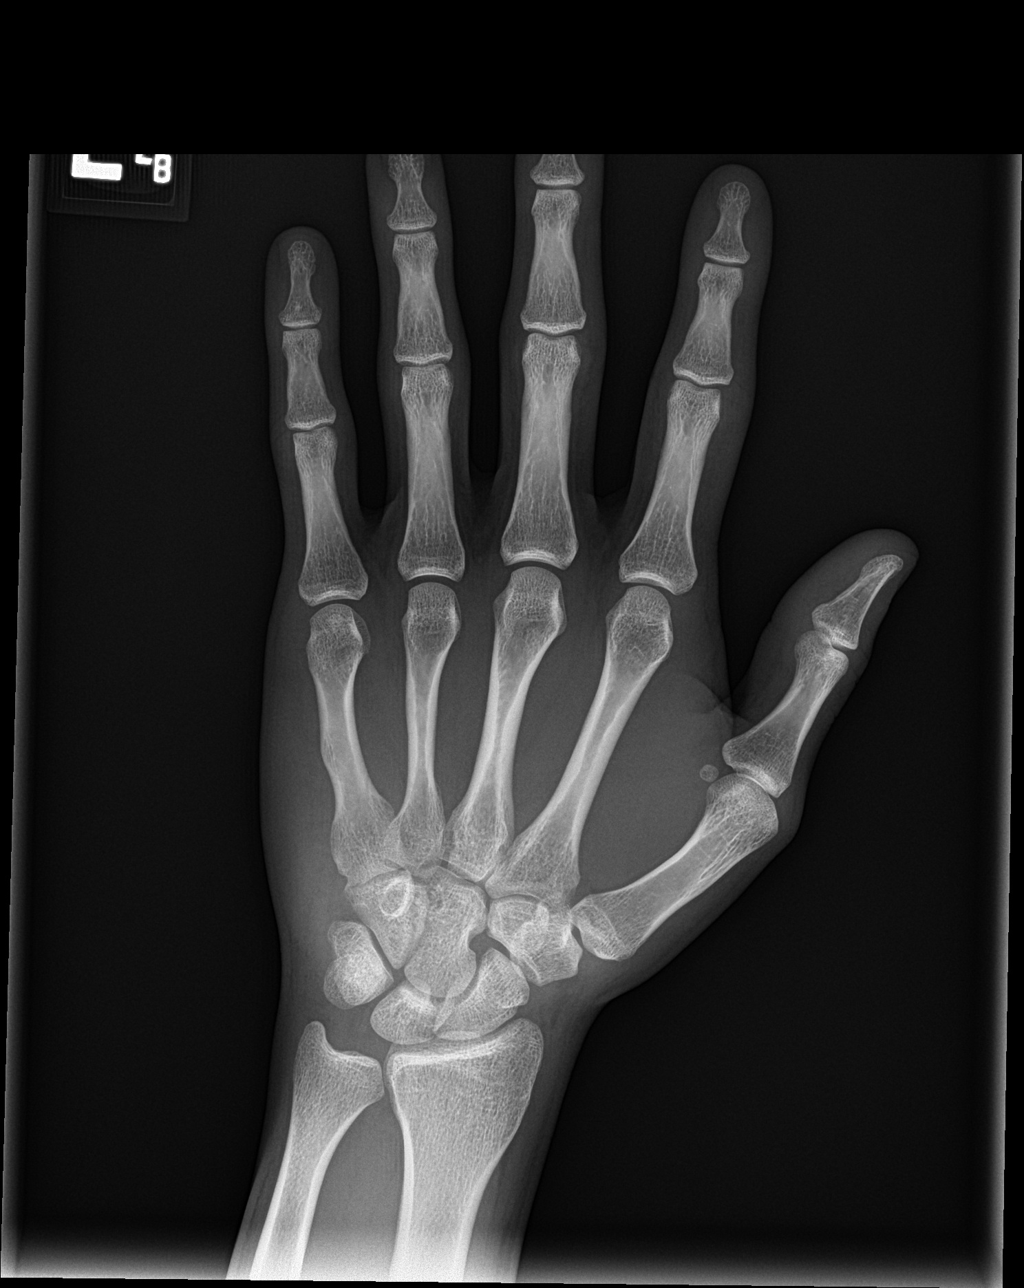

[hand obl]
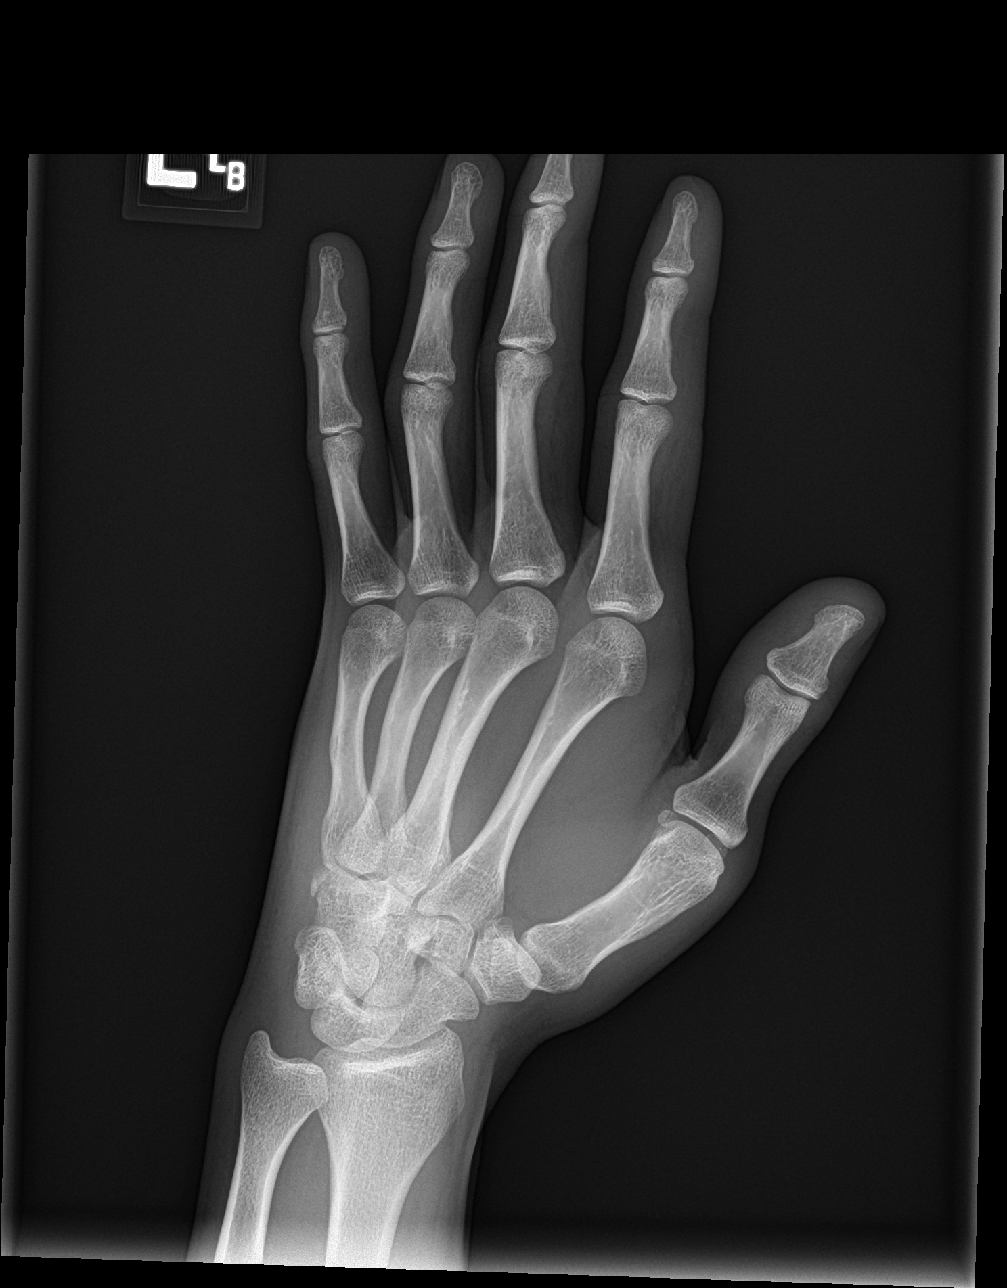

[Series 3: hand lat · 0.14mm/px · 2 of 2 slices shown]
[im 1/2]
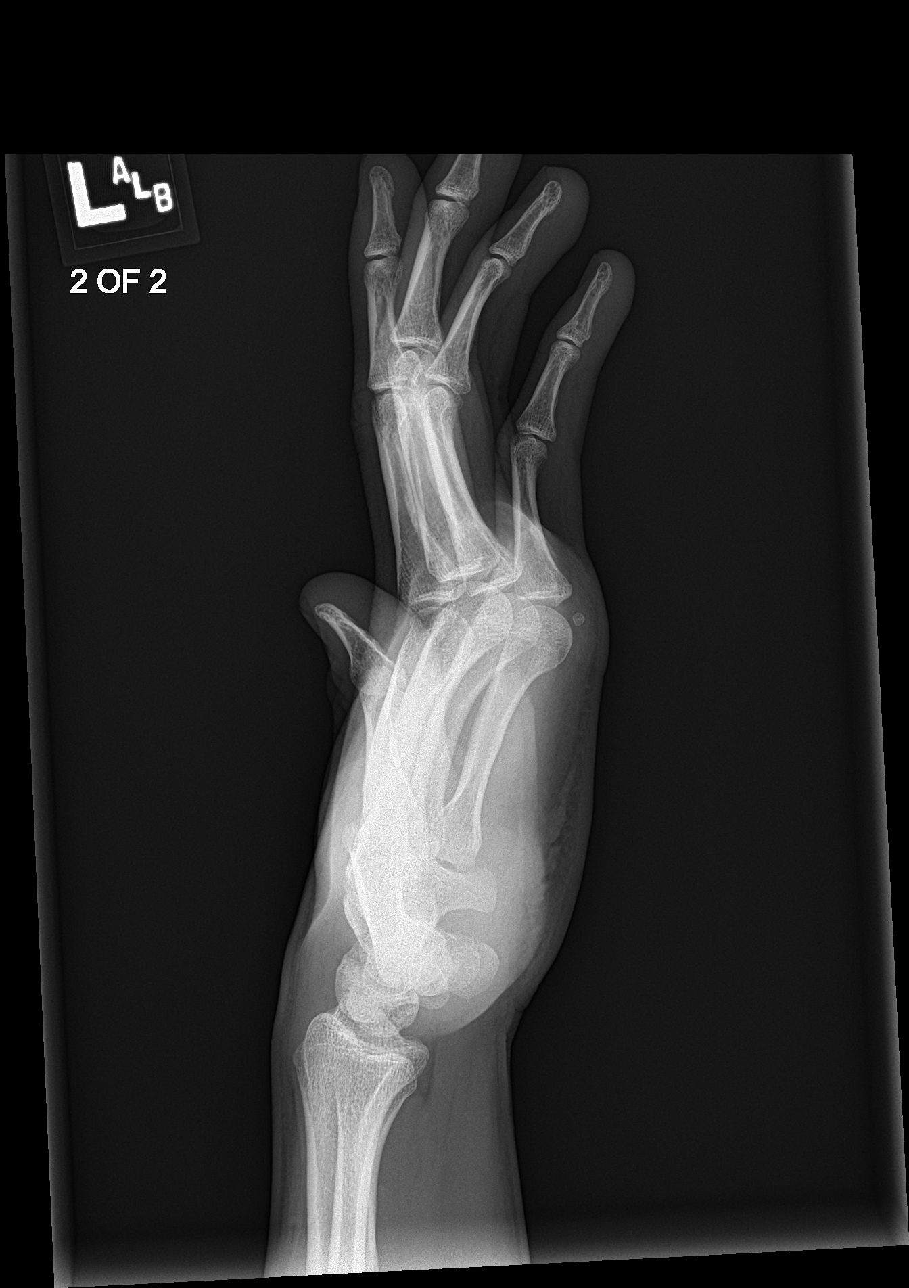
[im 2/2]
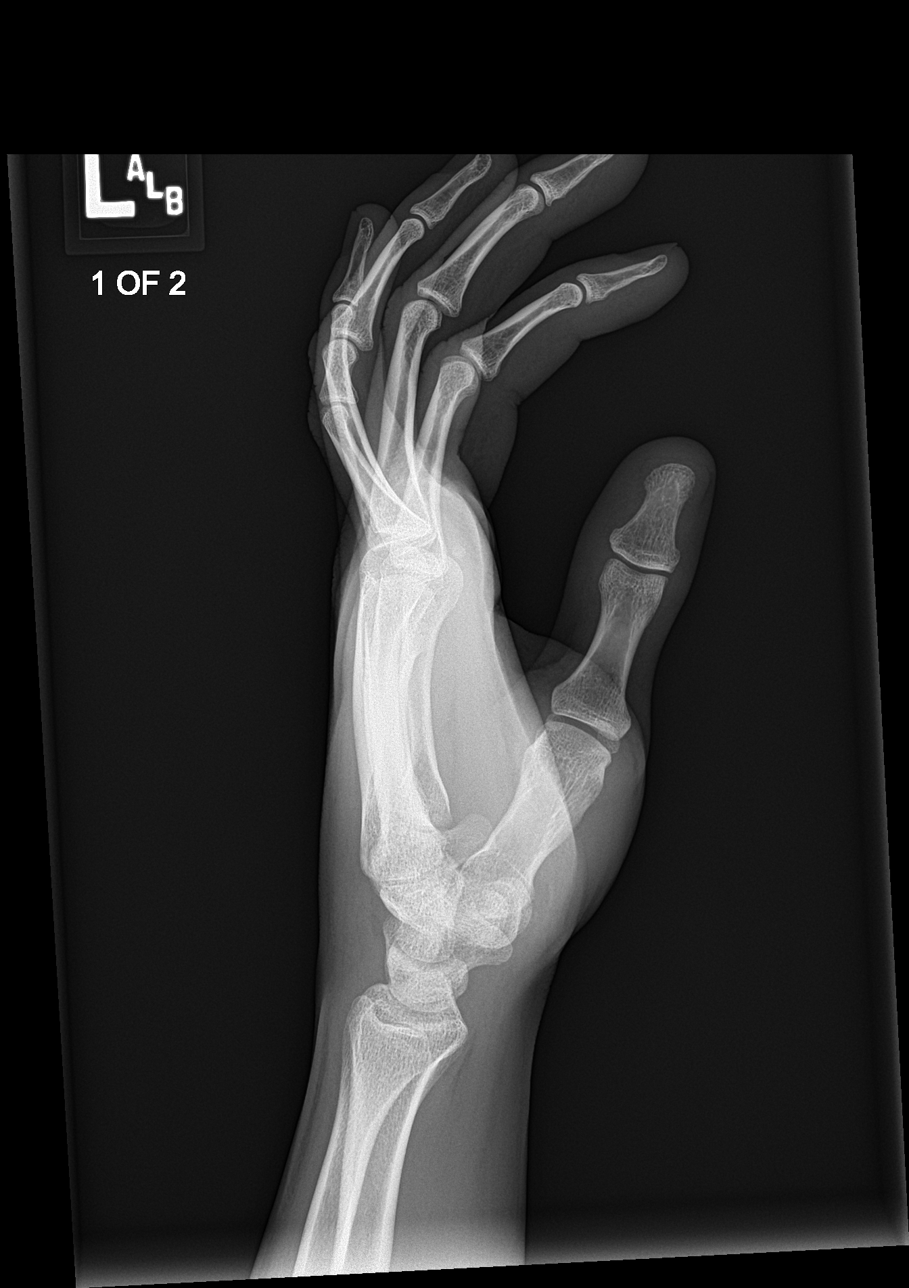

[4 of 4 positions shown; findings below may reference images not displayed]

FINDINGS: Mild deformity of the fifth metacarpal consistent with chronic
healed fracture. No acute fracture or arthropathy.
IMPRESSION: Negative.

## 2019-10-23 ENCOUNTER — Ambulatory Visit: Payer: Commercial Managed Care - PPO | Admitting: Internal Medicine

## 2019-10-23 ENCOUNTER — Encounter: Payer: Self-pay | Admitting: Internal Medicine

## 2019-10-23 ENCOUNTER — Other Ambulatory Visit: Payer: Self-pay

## 2019-10-23 VITALS — BP 140/88 | HR 80 | Temp 97.9°F | Ht 70.0 in | Wt 142.0 lb

## 2019-10-23 DIAGNOSIS — Z23 Encounter for immunization: Secondary | ICD-10-CM | POA: Diagnosis not present

## 2019-10-23 DIAGNOSIS — Z0000 Encounter for general adult medical examination without abnormal findings: Secondary | ICD-10-CM

## 2019-10-23 DIAGNOSIS — E109 Type 1 diabetes mellitus without complications: Secondary | ICD-10-CM | POA: Diagnosis not present

## 2019-10-23 LAB — LIPID PANEL
Cholesterol: 185 mg/dL (ref 0–200)
HDL: 53.6 mg/dL (ref >39.00)
LDL Cholesterol: 120 mg/dL — ABNORMAL HIGH (ref 0–99)
NonHDL: 131.67
Total CHOL/HDL Ratio: 3
Triglycerides: 59 mg/dL (ref 0.0–149.0)
VLDL: 11.8 mg/dL (ref 0.0–40.0)

## 2019-10-23 LAB — CBC
HCT: 46.8 % (ref 39.0–52.0)
Hemoglobin: 16 g/dL (ref 13.0–17.0)
MCHC: 34.3 g/dL (ref 30.0–36.0)
MCV: 87.8 fl (ref 78.0–100.0)
Platelets: 214 10*3/uL (ref 150.0–400.0)
RBC: 5.33 Mil/uL (ref 4.22–5.81)
RDW: 12.8 % (ref 11.5–15.5)
WBC: 6.7 10*3/uL (ref 4.0–10.5)

## 2019-10-23 LAB — HEMOGLOBIN A1C: Hgb A1c MFr Bld: 8.3 % — ABNORMAL HIGH (ref 4.6–6.5)

## 2019-10-23 LAB — COMPREHENSIVE METABOLIC PANEL
ALT: 10 U/L (ref 0–53)
AST: 13 U/L (ref 0–37)
Albumin: 4.7 g/dL (ref 3.5–5.2)
Alkaline Phosphatase: 62 U/L (ref 39–117)
BUN: 11 mg/dL (ref 6–23)
CO2: 31 mEq/L (ref 19–32)
Calcium: 9.8 mg/dL (ref 8.4–10.5)
Chloride: 99 mEq/L (ref 96–112)
Creatinine, Ser: 0.98 mg/dL (ref 0.40–1.50)
GFR: 93.45 mL/min (ref >60.00)
Glucose, Bld: 296 mg/dL — ABNORMAL HIGH (ref 70–99)
Potassium: 4.4 mEq/L (ref 3.5–5.1)
Sodium: 135 mEq/L (ref 135–145)
Total Bilirubin: 0.9 mg/dL (ref 0.2–1.2)
Total Protein: 7.2 g/dL (ref 6.0–8.3)

## 2019-10-23 LAB — T4, FREE: Free T4: 0.89 ng/dL (ref 0.60–1.60)

## 2019-10-23 LAB — HM DIABETES FOOT EXAM

## 2019-10-23 NOTE — Assessment & Plan Note (Signed)
Healthy Will update Td Exercises Recommended COVID and flu vaccines----he is not sure

## 2019-10-23 NOTE — Progress Notes (Signed)
Subjective:    Patient ID: Sean Perkins, male    DOB: 27-Feb-1996, 24 y.o.   MRN: [id number]  HPI Here to reestablish care and for physical This visit occurred during the SARS-CoV-2 public health emergency.  Safety protocols were in place, including screening questions prior to the visit, additional usage of staff PPE, and extensive cleaning of exam room while observing appropriate contact time as indicated for disinfecting solutions.   Taking novolog before meals---based on carbs Nightly basaglar No serious hypoglycemic Doesn't have CGM yet---checks 5-6 times a day Typical AM under 100. 180 before lunch, lower before other meals Eye exam due No numbness, sores or pain in feet  Current Outpatient Medications on File Prior to Visit  Medication Sig Dispense Refill  . Blood Glucose Monitoring Suppl (ONE TOUCH ULTRA MINI) w/Device KIT by Does not apply route.    Marland Kitchen glucose blood (ONE TOUCH ULTRA TEST) test strip Use as directed to check BG 6 x daily    . Insulin Glargine (BASAGLAR KWIKPEN) 100 UNIT/ML SOPN Inject 27 Units into the skin at bedtime.     Marland Kitchen NOVOLOG FLEXPEN 100 UNIT/ML FlexPen 3 (three) times daily with meals.     No current facility-administered medications on file prior to visit.    No Known Allergies  Past Medical History:  Diagnosis Date  . Type 1 diabetes ([place])     History reviewed. No pertinent surgical history.  Family History  Problem Relation Age of Onset  . Fibromyalgia Mother   . Hypertension Mother   . Hyperlipidemia Father   . Goiter Sister   . Cancer Other        breast  . Heart disease Other     Social History   Socioeconomic History  . Marital status: Single    Spouse name: Not on file  . Number of children: Not on file  . Years of education: Not on file  . Highest education level: Not on file  Occupational History  . Occupation: Physiological scientist: [employer]  Tobacco Use  . Smoking status: Never  Smoker  . Smokeless tobacco: Never Used  Substance and Sexual Activity  . Alcohol use: No    Alcohol/week: 0.0 standard drinks  . Drug use: No  . Sexual activity: Not on file  Other Topics Concern  . 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 Difficulty of Paying Living Expenses:   Food Insecurity:   . Worried About Charity fundraiser in the Last Year:   . Arboriculturist in the Last Year:   Transportation Needs:   . Film/video editor (Medical):   Marland Kitchen Lack of Transportation (Non-Medical):   Physical Activity:   . Days of Exercise per Week:   . Minutes of Exercise per Session:   Stress:   . Feeling of Stress :   Social Connections:   . Frequency of Communication with Friends and Family:   . Frequency of Social Gatherings with Friends and Family:   . Attends Religious Services:   . Active Member of Clubs or Organizations:   . Attends Archivist Meetings:   Marland Kitchen Marital Status:   Intimate Partner Violence:   . Fear of Current or Ex-Partner:   . Emotionally Abused:   .  Physically Abused:   . Sexually Abused:    Review of Systems  Constitutional: Negative for fatigue and unexpected weight change.       Wears seat belt  HENT: Negative for dental problem, hearing loss, tinnitus and trouble swallowing.        Due for dentist  Eyes: Negative for visual disturbance.  Respiratory: Negative for cough, chest tightness and shortness of breath.   Cardiovascular: Negative for chest pain, palpitations and leg swelling.  Gastrointestinal: Negative for blood in stool and constipation.       No heartburn  Endocrine: Negative for polydipsia and polyuria.  Genitourinary: Negative for difficulty urinating and dysuria.       No sexual problems Uses condoms  Musculoskeletal: Negative for arthralgias, back pain and joint swelling.  Skin:  Negative for rash.  Allergic/Immunologic: Negative for environmental allergies and immunocompromised state.  Neurological: Negative for dizziness, syncope, light-headedness and headaches.       Did try Kratom for ADHD--advised to avoid  Hematological: Negative for adenopathy. Does not bruise/bleed easily.  Psychiatric/Behavioral: Negative for dysphoric mood and sleep disturbance. The patient is not nervous/anxious.        Objective:   Physical Exam  Constitutional: He is oriented to person, place, and time. He appears well-developed. No distress.  HENT:  Head: Normocephalic and atraumatic.  Right Ear: External ear normal.  Left Ear: External ear normal.  Mouth/Throat: Oropharynx is clear and moist.  Eyes: Pupils are equal, round, and reactive to light. Conjunctivae are normal.  Neck: No thyromegaly present.  Cardiovascular: Normal rate, regular rhythm, normal heart sounds and intact distal pulses. Exam reveals no gallop.  No murmur heard. Respiratory: Effort normal and breath sounds normal. No respiratory distress. He has no wheezes. He has no rales.  GI: Soft. There is no abdominal tenderness.  Genitourinary:    Genitourinary Comments: Normal testes   Musculoskeletal:        General: No tenderness or edema.  Lymphadenopathy:    He has no cervical adenopathy.  Neurological: He is alert and oriented to person, place, and time.  Skin: No rash noted. No erythema.  Psychiatric: He has a normal mood and affect. His behavior is normal.           Assessment & Plan:

## 2019-10-23 NOTE — Assessment & Plan Note (Signed)
Seems to have reasonable control Will check labs and that will be available for his endocrine appt in 1-2 weeks Needs CGM--will try to get from endocrine

## 2019-10-23 NOTE — Addendum Note (Signed)
Addended by: Eual Fines on: 10/23/2019 11:36 AM   Modules accepted: Orders

## 2020-04-29 ENCOUNTER — Encounter: Payer: Self-pay | Admitting: Podiatry

## 2020-04-29 ENCOUNTER — Other Ambulatory Visit: Payer: Self-pay

## 2020-04-29 ENCOUNTER — Ambulatory Visit: Payer: Commercial Managed Care - PPO

## 2020-04-29 ENCOUNTER — Ambulatory Visit: Payer: Commercial Managed Care - PPO | Admitting: Podiatry

## 2020-04-29 DIAGNOSIS — B070 Plantar wart: Secondary | ICD-10-CM | POA: Diagnosis not present

## 2020-04-29 DIAGNOSIS — S99921A Unspecified injury of right foot, initial encounter: Secondary | ICD-10-CM

## 2020-04-30 ENCOUNTER — Encounter: Payer: Self-pay | Admitting: Podiatry

## 2020-04-30 NOTE — Progress Notes (Signed)
  Subjective:  Patient ID: Sean Perkins, male    DOB: [birth year],  MRN: [id number]  Chief Complaint  Patient presents with  . Callouses    Patient presents today for callous lesion bottom of right forefoot x 1 month  "it feels like Im walking on a rock sometimes"    24 y.o. male presents with the above complaint. Patient presents with complaint of right submetatarsal 4 hyperkeratotic lesion consistent with wart. Patient states it has been present for 1 month feels like walking on walks rock sometimes. He has not tried any treatment options he has not been seen anyone else prior to seeing me. He denies any other acute complaints. He would like to discuss treatment options.   Review of Systems: Negative except as noted in the HPI. Denies N/V/F/Ch.  Past Medical History:  Diagnosis Date  . Type 1 diabetes ([place])     Current Outpatient Medications:  .  Blood Glucose Monitoring Suppl (ONE TOUCH ULTRA MINI) w/Device KIT, by Does not apply route., Disp: , Rfl:  .  glucose blood (ONE TOUCH ULTRA TEST) test strip, Use as directed to check BG 6 x daily, Disp: , Rfl:  .  Insulin Glargine (BASAGLAR KWIKPEN) 100 UNIT/ML SOPN, Inject 27 Units into the skin at bedtime. , Disp: , Rfl:  .  NOVOLOG FLEXPEN 100 UNIT/ML FlexPen, 3 (three) times daily with meals., Disp: , Rfl:   Social History   Tobacco Use  Smoking Status Never Smoker  Smokeless Tobacco Never Used    No Known Allergies Objective:  There were no vitals filed for this visit. There is no height or weight on file to calculate BMI. Constitutional Well developed. Well nourished.  Vascular Dorsalis pedis pulses palpable bilaterally. Posterior tibial pulses palpable bilaterally. Capillary refill normal to all digits.  No cyanosis or clubbing noted. Pedal hair growth normal.  Neurologic Normal speech. Oriented to person, place, and time. Epicritic sensation to light touch grossly present bilaterally.  Dermatologic  hyperkeratotic  lesion noted submetatarsal 4 on the right foot. Pain on palpation to the lesion. Upon debridement pinpoint bleeding noted consistent with plantar verruca.  Orthopedic: Normal joint ROM without pain or crepitus bilaterally. No visible deformities. No bony tenderness.   Radiographs: None Assessment:   1. Plantar verruca    Plan:  Patient was evaluated and treated and all questions answered.  Right submetatarsal 4 plantar verruca --Lesion was debrided today without complications. Hemostasis was achieved and the area was cleaned. Cantharone was applied followed by an occlusive bandage. Post procedure complications were discussed. Monitor for signs or symptoms of infection and directed to call the office mainly should any occur. -I explained to the patient that it generally takes about 3 applications. Today will be the first application  No follow-ups on file.

## 2020-05-13 ENCOUNTER — Ambulatory Visit: Payer: Commercial Managed Care - PPO | Admitting: Podiatry

## 2020-05-15 ENCOUNTER — Encounter: Payer: Self-pay | Admitting: Podiatry

## 2020-05-15 ENCOUNTER — Other Ambulatory Visit: Payer: Self-pay

## 2020-05-15 ENCOUNTER — Ambulatory Visit: Payer: Commercial Managed Care - PPO | Admitting: Podiatry

## 2020-05-15 DIAGNOSIS — B070 Plantar wart: Secondary | ICD-10-CM

## 2020-05-15 NOTE — Progress Notes (Signed)
  Subjective:  Patient ID: Sean Perkins, male    DOB: Feb 27, 1996,  MRN: [id number]  Chief Complaint  Patient presents with  . Plantar Warts    "its still a little sore"    24 y.o. male presents with the above complaint.  Patient presents with right submetatarsal 4 plantar verruca.  He states is doing better.  He did not have much pain.  He states it was a little sore but it got better over time.  He denies any other acute complaints.  He is a type I diabetic.   Review of Systems: Negative except as noted in the HPI. Denies N/V/F/Ch.  Past Medical History:  Diagnosis Date  . Type 1 diabetes ([place])     Current Outpatient Medications:  .  Blood Glucose Monitoring Suppl (ONE TOUCH ULTRA MINI) w/Device KIT, by Does not apply route., Disp: , Rfl:  .  glucose blood (ONE TOUCH ULTRA TEST) test strip, Use as directed to check BG 6 x daily, Disp: , Rfl:  .  Insulin Glargine (BASAGLAR KWIKPEN) 100 UNIT/ML SOPN, Inject 27 Units into the skin at bedtime. , Disp: , Rfl:  .  NOVOLOG FLEXPEN 100 UNIT/ML FlexPen, 3 (three) times daily with meals., Disp: , Rfl:   Social History   Tobacco Use  Smoking Status Never Smoker  Smokeless Tobacco Never Used    No Known Allergies Objective:  There were no vitals filed for this visit. There is no height or weight on file to calculate BMI. Constitutional Well developed. Well nourished.  Vascular Dorsalis pedis pulses palpable bilaterally. Posterior tibial pulses palpable bilaterally. Capillary refill normal to all digits.  No cyanosis or clubbing noted. Pedal hair growth normal.  Neurologic Normal speech. Oriented to person, place, and time. Epicritic sensation to light touch grossly present bilaterally.  Dermatologic  hyperkeratotic lesion noted submetatarsal 4 on the right foot.  No pain on palpation to the lesion. Upon debridemen no further t pinpoint bleeding noted consistent with plantar verruca.  Orthopedic: Normal joint ROM without pain or  crepitus bilaterally. No visible deformities. No bony tenderness.   Radiographs: None Assessment:   No diagnosis found. Plan:  Patient was evaluated and treated and all questions answered.  Right submetatarsal 4 plantar verruca --Clinically the lesion has healed with 1 application of Cantharone therapy.  Patient is 95% better.  I discussed with him to continue monitoring the lesion and if it comes back we will discuss continue doing further Cantharone therapy.  He states understanding.  No follow-ups on file.

## 2020-06-19 ENCOUNTER — Ambulatory Visit: Payer: Commercial Managed Care - PPO | Admitting: Podiatry

## 2020-06-19 ENCOUNTER — Other Ambulatory Visit: Payer: Self-pay

## 2020-06-19 ENCOUNTER — Encounter: Payer: Self-pay | Admitting: Podiatry

## 2020-06-19 DIAGNOSIS — B070 Plantar wart: Secondary | ICD-10-CM

## 2020-06-19 NOTE — Progress Notes (Signed)
  Subjective:  Patient ID: Sean Perkins, male    DOB: Apr 12, 1996,  MRN: [id number]  Chief Complaint  Patient presents with  . Plantar Warts    "its doing better, still a little red"    24 y.o. male presents with the above complaint.  Patient presents with right submetatarsal 4 plantar verruca.  He states is doing better.  He is a type I diabetic.  He does not have any pain.  He just wants to make sure that there is no further lesion coming back.   Review of Systems: Negative except as noted in the HPI. Denies N/V/F/Ch.  Past Medical History:  Diagnosis Date  . Type 1 diabetes ([place])     Current Outpatient Medications:  .  Blood Glucose Monitoring Suppl (ONE TOUCH ULTRA MINI) w/Device KIT, by Does not apply route., Disp: , Rfl:  .  glucose blood (ONE TOUCH ULTRA TEST) test strip, Use as directed to check BG 6 x daily, Disp: , Rfl:  .  Insulin Glargine (BASAGLAR KWIKPEN) 100 UNIT/ML SOPN, Inject 27 Units into the skin at bedtime. , Disp: , Rfl:  .  NOVOLOG FLEXPEN 100 UNIT/ML FlexPen, 3 (three) times daily with meals., Disp: , Rfl:   Social History   Tobacco Use  Smoking Status Never Smoker  Smokeless Tobacco Never Used    No Known Allergies Objective:  There were no vitals filed for this visit. There is no height or weight on file to calculate BMI. Constitutional Well developed. Well nourished.  Vascular Dorsalis pedis pulses palpable bilaterally. Posterior tibial pulses palpable bilaterally. Capillary refill normal to all digits.  No cyanosis or clubbing noted. Pedal hair growth normal.  Neurologic Normal speech. Oriented to person, place, and time. Epicritic sensation to light touch grossly present bilaterally.  Dermatologic  hyperkeratotic lesion noted submetatarsal 4 on the right foot.  No pain on palpation to the lesion. Upon debridemen no further t pinpoint bleeding noted consistent with plantar verruca.  Orthopedic: Normal joint ROM without pain or crepitus  bilaterally. No visible deformities. No bony tenderness.   Radiographs: None Assessment:   No diagnosis found. Plan:  Patient was evaluated and treated and all questions answered.  Right submetatarsal 4 plantar verruca --The lesion has continues to clinically healed.  At this time I discussed with the patient that if it comes back to come back and see me.  Patient states understanding.  No follow-ups on file.

## 2022-08-12 ENCOUNTER — Telehealth: Payer: Self-pay

## 2022-08-12 NOTE — Telephone Encounter (Signed)
FW: Emergency Discharge on 08/12/2022 Received: Today Venia Carbon, MD  Pilar Grammes, CMA Check with him to see if I am still his doctor. If so, he needs an appt  We received an ER note on the pt. He has not been seen here since 2021. Tried to call pt on 2 different numbers. Left a message on the home # and VM was not set up on cell.

## 2022-08-12 NOTE — Telephone Encounter (Signed)
Sent a [employer], also.

## 2022-08-16 ENCOUNTER — Telehealth: Payer: Self-pay

## 2022-08-16 NOTE — Transitions of Care (Post Inpatient/ED Visit) (Unsigned)
   08/16/2022  Name: Sean Perkins MRN: [id number] DOB: [birth year]  Today's TOC FU Call Status: Today's TOC FU Call Status:: Unsuccessul Call (1st Attempt) Unsuccessful Call (1st Attempt) Date: 08/16/22  Attempted to reach the patient regarding the most recent Inpatient/ED visit.  Follow Up Plan: Additional outreach attempts will be made to reach the patient to complete the Transitions of Care (Post Inpatient/ED visit) call.   [place] LPN [place] Advisor Direct Dial [phone]

## 2022-08-17 NOTE — Transitions of Care (Post Inpatient/ED Visit) (Signed)
   08/17/2022  Name: PANTELIS ELGERSMA MRN: [id number] DOB: Sep 18, 1995  Today's TOC FU Call Status: Today's TOC FU Call Status:: Successful TOC FU Call Competed Unsuccessful Call (1st Attempt) Date: 08/16/22 [facility] FU Call Complete Date: 08/17/22  Transition Care Management Follow-up Telephone Call Date of Discharge: 08/12/22 Discharge Facility: Other Facility Name of Other Discharge Facility: [facility] Type of Discharge: Emergency Department Reason for ED Visit:  (DM) How have you been since you were released from the hospital?: Better Any questions or concerns?: No  Items Reviewed: Did you receive and understand the discharge instructions provided?: Yes Medications obtained and verified?: Yes (Medications Reviewed) Any new allergies since your discharge?: No Dietary orders reviewed?: NA Do you have support at home?: Yes  Home Care and Equipment/Supplies: [place] Ordered?: NA Any new equipment or medical supplies ordered?: NA  Functional Questionnaire: Do you need assistance with bathing/showering or dressing?: No Do you need assistance with meal preparation?: No Do you need assistance with eating?: No Do you have difficulty maintaining continence: No Do you need assistance with getting out of bed/getting out of a chair/moving?: No Do you have difficulty managing or taking your medications?: No  Folllow up appointments reviewed: PCP Follow-up appointment confirmed?: NA Specialist Hospital Follow-up appointment confirmed?: NA Do you need transportation to your follow-up appointment?: No Do you understand care options if your condition(s) worsen?: Yes-patient verbalized understanding    [place] LPN [place] [phone]

## 2022-11-08 ENCOUNTER — Telehealth: Payer: Self-pay | Admitting: Internal Medicine

## 2022-11-08 NOTE — Telephone Encounter (Signed)
Pt called in requesting a copy of his immunization would like a call to know how soon he can pit it up  # [phone] (

## 2022-11-08 NOTE — Telephone Encounter (Signed)
Spoke to pt. Immunization record up front.

## 2024-02-10 ENCOUNTER — Ambulatory Visit: Admitting: Podiatry

## 2024-02-10 ENCOUNTER — Encounter: Payer: Self-pay | Admitting: Podiatry

## 2024-02-10 VITALS — Ht 70.0 in | Wt 142.0 lb

## 2024-02-10 DIAGNOSIS — S91201A Unspecified open wound of right great toe with damage to nail, initial encounter: Secondary | ICD-10-CM

## 2024-02-10 NOTE — Progress Notes (Signed)
   Chief Complaint  Patient presents with   Nail Problem    Pt is here due to right great toenail states a month ago his toenail got caught in the screen door and lifted up, thinks it has not healed properly, states it stills lifts up, does not hurt just concerned about it.    HPI: 28 y.o. male presenting today for above complaint  Past Medical History:  Diagnosis Date   Type 1 diabetes (HCC)     No past surgical history on file.  No Known Allergies   Physical Exam: General: The patient is alert and oriented x3 in no acute distress.  Dermatology: Skin is warm, dry and supple bilateral lower extremities. Partially detached hallux nail plate noted to the right hallux.  The base of the nail appears very stable  Vascular: Palpable pedal pulses bilaterally. Capillary refill within normal limits.  No appreciable edema.  No erythema.  Neurological: Grossly intact via light touch  Musculoskeletal Exam: No pedal deformities noted  Assessment/Plan of Care: 1. Traumatic injury to right hallux nail plate  -Patient evaluated. Currently the nail is asymptomatic and does not bother him -Recommend conservative treatment and simple observation as the nail grows out. If he notices the nail becomes infected or he sustains additional trauma to the nail recommend returning to the office for total temporary nail avulsion of the nail plate.  -Return to clinic prn  [employer] police for [facility]     Thresa EMERSON Sar, DPM [facility]  Dr. Thresa EMERSON Sar, DPM    [address] [place], [place] [address]                Office [phone]  Fax [phone]
